# Patient Record
Sex: Male | Born: 1990 | Race: White | Hispanic: No | Marital: Single | Smoking: Current every day smoker
Health system: Southern US, Community
[De-identification: ages and names within clinical notes are randomized; demographics above are authoritative.]

---

## 2011-03-18 ENCOUNTER — Emergency Department (HOSPITAL_COMMUNITY)
Admission: EM | Admit: 2011-03-18 | Discharge: 2011-03-18 | Disposition: A | Discharge status: Home / Self Care | Payer: No Typology Code available for payment source | Attending: Emergency Medicine | Admitting: Emergency Medicine

## 2011-03-18 DIAGNOSIS — M545 Low back pain, unspecified: Secondary | ICD-10-CM | POA: Insufficient documentation

## 2011-03-18 DIAGNOSIS — T148XXA Other injury of unspecified body region, initial encounter: Secondary | ICD-10-CM | POA: Insufficient documentation

## 2011-03-18 DIAGNOSIS — M542 Cervicalgia: Secondary | ICD-10-CM | POA: Insufficient documentation

## 2011-03-18 DIAGNOSIS — F319 Bipolar disorder, unspecified: Secondary | ICD-10-CM | POA: Insufficient documentation

## 2012-04-29 ENCOUNTER — Encounter (HOSPITAL_COMMUNITY): Payer: Self-pay

## 2012-04-29 ENCOUNTER — Emergency Department (HOSPITAL_COMMUNITY)
Admission: EM | Admit: 2012-04-29 | Discharge: 2012-04-30 | Disposition: A | Discharge status: Home / Self Care | Payer: Medicaid Other | Attending: Emergency Medicine | Admitting: Emergency Medicine

## 2012-04-29 DIAGNOSIS — L0231 Cutaneous abscess of buttock: Secondary | ICD-10-CM | POA: Insufficient documentation

## 2012-04-29 DIAGNOSIS — F17200 Nicotine dependence, unspecified, uncomplicated: Secondary | ICD-10-CM | POA: Insufficient documentation

## 2012-04-29 MED ORDER — SULFAMETHOXAZOLE-TMP DS 800-160 MG PO TABS
2.0000 | ORAL_TABLET | Freq: Once | ORAL | Status: AC
  Administered 2012-04-29: 2 via ORAL
  Filled 2012-04-29: qty 2

## 2012-04-29 MED ORDER — LIDOCAINE HCL (PF) 1 % IJ SOLN
5.0000 mL | Freq: Once | INTRAMUSCULAR | Status: AC
  Administered 2012-04-29: 5 mL

## 2012-04-29 MED ORDER — OXYCODONE-ACETAMINOPHEN 5-325 MG PO TABS: 1.0000 | ORAL_TABLET | ORAL | Status: AC | PRN

## 2012-04-29 MED ORDER — OXYCODONE-ACETAMINOPHEN 5-325 MG PO TABS
1.0000 | ORAL_TABLET | Freq: Once | ORAL | Status: AC
  Administered 2012-04-29: 1 via ORAL
  Filled 2012-04-29: qty 1

## 2012-04-29 MED ORDER — SULFAMETHOXAZOLE-TRIMETHOPRIM 800-160 MG PO TABS: 1.0000 | ORAL_TABLET | Freq: Two times a day (BID) | ORAL | Status: AC

## 2012-04-29 MED ORDER — LIDOCAINE HCL (PF) 1 % IJ SOLN
INTRAMUSCULAR | Status: AC
  Filled 2012-04-29: qty 5

## 2012-04-29 NOTE — ED Notes (Signed)
Have a boil on left buttock, was seen at [facility] for the same.

## 2012-04-29 NOTE — ED Provider Notes (Signed)
History     CSN: [id number]  Arrival date & time 04/29/12  2257   None     Chief Complaint  Patient presents with  . Abscess    HPI Brady Marshall is a 21 y.o. male who presents to the ED for an abscess. The abscess started 4 days ago. The abscess is located on the left buttock. He went to [facility] ED and the area was draining so they gave him Rx for antibiotics but they sent it to the wrong pharmacy and he did not get it. The area continues to drain and is very painful. He rates the pain 9/10.  Pain increases with sitting. He is not taking anything for pain. He has used warm compresses. The history was provided by the patient.   History reviewed. No pertinent past medical history.  History reviewed. No pertinent past surgical history.  History reviewed. No pertinent family history.  History  Substance Use Topics  . Smoking status: Current Everyday Smoker  . Smokeless tobacco: Not on file  . Alcohol Use: No      Review of Systems  Constitutional: Negative for fever, chills, diaphoresis and fatigue.  HENT: Negative for ear pain, congestion, sore throat, facial swelling, neck pain, neck stiffness, dental problem and sinus pressure.   Eyes: Negative for photophobia, pain and discharge.  Respiratory: Negative for cough, chest tightness and wheezing.   Cardiovascular: Negative for chest pain and palpitations.  Gastrointestinal: Negative for nausea, vomiting, abdominal pain, diarrhea, constipation and abdominal distention.  Genitourinary: Negative for dysuria, frequency, flank pain and difficulty urinating.  Musculoskeletal: Negative for myalgias, back pain and gait problem.  Skin: Negative for color change.       Abscess left buttock  Neurological: Positive for headaches. Negative for dizziness, speech difficulty, weakness, light-headedness and numbness.  Psychiatric/Behavioral: Negative for confusion and agitation. Nervous/anxious: bipolar, taking medication.     Allergies    Review of patient's allergies indicates no known allergies.  Home Medications  No current outpatient prescriptions on file.  BP 139/74  Pulse 80  Temp 98.4 F (36.9 C) (Oral)  Resp 16  Ht 5\' 9"  (1.753 m)  Wt 190 lb (86.183 kg)  BMI 28.06 kg/m2  SpO2 100%  Physical Exam  Nursing note and vitals reviewed. Constitutional: He is oriented to person, place, and time. He appears well-developed and well-nourished. No distress.  HENT:  Head: Normocephalic and atraumatic.  Eyes: EOM are normal.  Neck: Neck supple.  Cardiovascular: Normal rate.   Pulmonary/Chest: Effort normal. No respiratory distress. He has no wheezes.  Abdominal: Soft. There is no tenderness.       Raised, tender area with erythema left buttocks. A second area is smaller raise and red but does not appear ready for I&D.  Musculoskeletal: Normal range of motion. He exhibits no edema.  Neurological: He is alert and oriented to person, place, and time. No cranial nerve deficit.  Skin: Skin is warm and dry.  Psychiatric: He has a normal mood and affect. His behavior is normal. Judgment and thought content normal.    ED Course  Procedures Verbal consent.   Patient positioned and draped with sterile towels.  Preoperative medication:  Area cleaned with betadine Local infiltrate with lidocaine 1%. Amount 3 ccs  I&D Scalpel size: #11blade Incision type: Straight single Complexity: Complex Drained small amount of purulent drainage Probed with curved hemostat to break up loculations Irrigated with NSS  Wound treatment:  Packing: Iodoform gauze Patient tolerance: Tolerated procedure well.  Assessment: Abscess left buttocks  Plan:  Septra DS   Percocet   Sitz baths   Return in 48 hours for packing removal return sooner for problems. MDM   Medication List  As of 04/29/2012 11:51 PM   START taking these medications         oxyCODONE-acetaminophen 5-325 MG per tablet   Commonly known as: PERCOCET/ROXICET    Take 1 tablet by mouth every 4 (four) hours as needed for pain.      sulfamethoxazole-trimethoprim 800-160 MG per tablet   Commonly known as: BACTRIM DS,SEPTRA DS   Take 1 tablet by mouth every 12 (twelve) hours.          Where to get your medications    These are the prescriptions that you need to pick up.   You may get these medications from any pharmacy.         oxyCODONE-acetaminophen 5-325 MG per tablet   sulfamethoxazole-trimethoprim 800-160 MG per tablet           Follow-up Information    Follow up with [facility] ED in 2 days.   Contact information:   [address] [place] [address]         I have reviewed the patients vital signs and nurses notes. I have discussed procedure and plan of care with the patient. He will continue warm water soaks and antbitiotics and hopefully the second area will improve without I&D. He will return in 2 days for recheck and packing removal. Patient voices understanding.       Janne Napoleon, [place] 04/29/12 [phone]

## 2012-04-30 NOTE — ED Provider Notes (Signed)
Medical screening examination/treatment/procedure(s) were performed by non-physician practitioner and as supervising physician I was immediately available for consultation/collaboration.   Davian Wollenberg, MD 04/30/12 1502 

## 2015-06-28 ENCOUNTER — Emergency Department (HOSPITAL_COMMUNITY)
Admission: EM | Admit: 2015-06-28 | Discharge: 2015-06-29 | Disposition: A | Discharge status: Home / Self Care | Payer: Self-pay | Attending: Emergency Medicine | Admitting: Emergency Medicine

## 2015-06-28 ENCOUNTER — Emergency Department (HOSPITAL_COMMUNITY): Payer: Medicaid Other

## 2015-06-28 ENCOUNTER — Encounter (HOSPITAL_COMMUNITY): Payer: Self-pay | Admitting: *Deleted

## 2015-06-28 DIAGNOSIS — R000 Tachycardia, unspecified: Secondary | ICD-10-CM | POA: Insufficient documentation

## 2015-06-28 DIAGNOSIS — N50812 Left testicular pain: Secondary | ICD-10-CM

## 2015-06-28 DIAGNOSIS — N50819 Testicular pain, unspecified: Secondary | ICD-10-CM

## 2015-06-28 DIAGNOSIS — Z720 Tobacco use: Secondary | ICD-10-CM | POA: Insufficient documentation

## 2015-06-28 DIAGNOSIS — N451 Epididymitis: Secondary | ICD-10-CM | POA: Insufficient documentation

## 2015-06-28 LAB — URINE MICROSCOPIC-ADD ON

## 2015-06-28 LAB — URINALYSIS, ROUTINE W REFLEX MICROSCOPIC
BILIRUBIN URINE: NEGATIVE
GLUCOSE, UA: 250 mg/dL — AB
Nitrite: NEGATIVE
PH: 6 (ref 5.0–8.0)
Protein, ur: NEGATIVE mg/dL
Urobilinogen, UA: 1 mg/dL (ref 0.0–1.0)

## 2015-06-28 MED ORDER — DOXYCYCLINE HYCLATE 100 MG PO TABS
100.0000 mg | ORAL_TABLET | Freq: Once | ORAL | Status: AC
  Administered 2015-06-28: 100 mg via ORAL
  Filled 2015-06-28: qty 1

## 2015-06-28 MED ORDER — CEFTRIAXONE SODIUM 250 MG IJ SOLR
250.0000 mg | Freq: Once | INTRAMUSCULAR | Status: AC
  Administered 2015-06-28: 250 mg via INTRAMUSCULAR
  Filled 2015-06-28: qty 250

## 2015-06-28 MED ORDER — MORPHINE SULFATE (PF) 4 MG/ML IV SOLN
INTRAVENOUS | Status: AC
  Administered 2015-06-28: 4 mg
  Filled 2015-06-28: qty 1

## 2015-06-28 MED ORDER — HYDROCODONE-ACETAMINOPHEN 7.5-325 MG PO TABS: 1.0000 | ORAL_TABLET | Freq: Four times a day (QID) | ORAL | Status: DC | PRN

## 2015-06-28 MED ORDER — SODIUM CHLORIDE 0.9 % IV BOLUS (SEPSIS): 1000.0000 mL | Freq: Once | INTRAVENOUS | Status: DC

## 2015-06-28 MED ORDER — PROMETHAZINE HCL 25 MG/ML IJ SOLN: 12.5000 mg | Freq: Four times a day (QID) | INTRAMUSCULAR | Status: DC | PRN

## 2015-06-28 MED ORDER — LIDOCAINE HCL (PF) 1 % IJ SOLN
INTRAMUSCULAR | Status: AC
  Administered 2015-06-28: 5 mL
  Filled 2015-06-28: qty 5

## 2015-06-28 MED ORDER — DOXYCYCLINE HYCLATE 100 MG PO CAPS: 100.0000 mg | ORAL_CAPSULE | Freq: Two times a day (BID) | ORAL | Status: AC

## 2015-06-28 NOTE — ED Provider Notes (Signed)
CSN: [id number]     Arrival date & time 06/28/15  2049 History   None    Chief Complaint  Patient presents with  . Groin Swelling     (Consider location/radiation/quality/duration/timing/severity/associated sxs/prior Treatment) HPI Comments: 24 y.o. Male with no significant past medical history presents for left testicle pain and swelling.  The patient reports the pain started abruptly when he turned over in his sleep last night.  He has had extreme pain and swelling of the left testicle since then that has been constant.  He reports normal urination.  He has never had anything like this happen before.  Denies penile complaints.   History reviewed. No pertinent past medical history. History reviewed. No pertinent past surgical history. History reviewed. No pertinent family history. Social History  Substance Use Topics  . Smoking status: Current Every Day Smoker  . Smokeless tobacco: None  . Alcohol Use: No    Review of Systems  Constitutional: Negative for fever, chills and fatigue.  HENT: Negative for congestion, postnasal drip and rhinorrhea.   Eyes: Negative for pain.  Respiratory: Negative for chest tightness and shortness of breath.   Cardiovascular: Negative for chest pain and palpitations.  Gastrointestinal: Negative for nausea, abdominal pain and diarrhea.  Genitourinary: Positive for scrotal swelling and testicular pain. Negative for urgency, hematuria, flank pain, decreased urine volume, discharge, penile swelling and penile pain.  Musculoskeletal: Negative for myalgias and back pain.  Neurological: Negative for dizziness, light-headedness and headaches.  Hematological: Does not bruise/bleed easily.      Allergies  Cinnamon and Honey bee treatment  Home Medications   Prior to Admission medications   Not on File   BP 140/93 mmHg  Pulse 124  Temp(Src) 98.1 F (36.7 C) (Oral)  Resp 20  Ht  (1.727 m)  Wt 198 lb (89.812 kg)  BMI 30.11 kg/m2  SpO2  100% Physical Exam  Constitutional: He is oriented to person, place, and time. He appears distressed (appears uncomfortable).  HENT:  Head: Normocephalic and atraumatic.  Right Ear: External ear normal.  Left Ear: External ear normal.  Mouth/Throat: Oropharynx is clear and moist. No oropharyngeal exudate.  Eyes: EOM are normal. Pupils are equal, round, and reactive to light.  Neck: Normal range of motion. Neck supple.  Cardiovascular: Regular rhythm, normal heart sounds and intact distal pulses.  Tachycardia present.   Pulmonary/Chest: Effort normal. No respiratory distress.  Abdominal: Soft. He exhibits no distension. There is no tenderness. Hernia confirmed negative in the right inguinal area and confirmed negative in the left inguinal area.  Genitourinary: Right testis shows no swelling and no tenderness. Cremasteric reflex is absent on the right side. Left testis shows swelling and tenderness. Cremasteric reflex is absent on the left side.  Patient would not allow a full examination of his penis.  Could not elicit cremasteric reflex on right or left side  Musculoskeletal: He exhibits no edema.  Lymphadenopathy:       Right: No inguinal adenopathy present.       Left: No inguinal adenopathy present.  Neurological: He is alert and oriented to person, place, and time.  Skin: Skin is warm and dry. No rash noted.  Vitals reviewed.   ED Course  Procedures (including critical care time) Labs Review Labs Reviewed  URINALYSIS, ROUTINE W REFLEX MICROSCOPIC (NOT AT [facility])  GC/CHLAMYDIA PROBE AMP () NOT AT [facility]    Imaging Review No results found. I have personally reviewed and evaluated these images and lab results as  part of my medical decision-making.   EKG Interpretation None      MDM  Patient seen and evaluated in stable condition.  Concern for torsion.  Unable to reduce.  [place] ordered.  UA and GC/Chlamydia ordered.  Patient given Morphine for pain control.  Case was  signed out to night team pending [place] results with plan for treatment for orchitis if negative for torsion. Final diagnoses:  None    1. Left testicular pain and swelling    Leta Baptist, MD 06/28/15 2312

## 2015-06-28 NOTE — Discharge Instructions (Signed)
You can take ibuprofen 600 mg 4 times a day OR Aleve 2 tabs twice a day for pain. Take the antibiotics until gone. Recheck if you seem worse, such as worsening swelling, increasing pain, trouble urinating.    Epididymitis Epididymitis is swelling (inflammation) of the epididymis. The epididymis is a cord-like structure that is located along the top and back part of the testicle. It collects and stores sperm from the testicle. This condition can also cause pain and swelling of the testicle and scrotum. Symptoms usually start suddenly (acute epididymitis). Sometimes epididymitis starts gradually and lasts for a while (chronic epididymitis). This type may be harder to treat. CAUSES In men 11 and younger, this condition is usually caused by a bacterial infection or sexually transmitted disease (STD), such as:  Gonorrhea.  Chlamydia.  In men 80 and older who do not have anal sex, this condition is usually caused by bacteria from a blockage or abnormalities in the urinary system. These can result from:  Having a tube placed into the bladder (urinary catheter).  Having an enlarged or inflamed prostate gland.  Having recent urinary tract surgery. In men who have a condition that weakens the body's defense system (immune system), such as HIV, this condition can be caused by:   Other bacteria, including tuberculosis and syphilis.  Viruses.  Fungi. Sometimes this condition occurs without infection. That may happen if urine flows backward into the epididymis after heavy lifting or straining. RISK FACTORS This condition is more likely to develop in men:  Who have unprotected sex with more than one partner.  Who have anal sex.   Who have recently had surgery.   Who have a urinary catheter.  Who have urinary problems.  Who have a suppressed immune system. SYMPTOMS  This condition usually begins suddenly with chills, fever, and pain behind the scrotum and in the testicle. Other  symptoms include:   Swelling of the scrotum, testicle, or both.  Pain whenejaculatingor urinating.  Pain in the back or belly.  Nausea.  Itching and discharge from the penis.  Frequent need to pass urine.  Redness and tenderness of the scrotum. DIAGNOSIS Your health care provider can diagnose this condition based on your symptoms and medical history. Your health care provider will also do a physical exam to ask about your symptoms and check your scrotum and testicle for swelling, pain, and redness. You may also have other tests, including:   Examination of discharge from the penis.  Urine tests for infections, such as STDs.  Your health care provider may test you for other STDs, including HIV. TREATMENT Treatment for this condition depends on the cause. If your condition is caused by a bacterial infection, oral antibiotic medicine may be prescribed. If the bacterial infection has spread to your blood, you may need to receive IV antibiotics. Nonbacterial epididymitis is treated with home care that includes bed rest and elevation of the scrotum. Surgery may be needed to treat:  Bacterial epididymitis that causes pus to build up in the scrotum (abscess).  Chronic epididymitis that has not responded to other treatments. HOME CARE INSTRUCTIONS Medicines  Take over-the-counter and prescription medicines only as told by your health care provider.   If you were prescribed an antibiotic medicine, take it as told by your health care provider. Do not stop taking the antibiotic even if your condition improves. Sexual Activity  If your epididymitis was caused by an STD, avoid sexual activity until your treatment is complete.  Inform your sexual partner  or partners if you test positive for an STD. They may need to be treated.Do not engage in sexual activity with your partner or partners until their treatment is completed. General Instructions  Return to your normal activities as  told by your health care provider. Ask your health care provider what activities are safe for you.  Keep your scrotum elevated and supported while resting. Ask your health care provider if you should wear a scrotal support, such as a jockstrap. Wear it as told by your health care provider.  If directed, apply ice to the affected area:   Put ice in a plastic bag.  Place a towel between your skin and the bag.  Leave the ice on for 20 minutes, 2-3 times per day.  Try taking a sitz bath to help with discomfort. This is a warm water bath that is taken while you are sitting down. The water should only come up to your hips and should cover your buttocks. Do this 3-4 times per day or as told by your health care provider.  Keep all follow-up visits as told by your health care provider. This is important. SEEK MEDICAL CARE IF:   You have a fever.   Your pain medicine is not helping.   Your pain is getting worse.   Your symptoms do not improve within three days.   This information is not intended to replace advice given to you by your health care provider. Make sure you discuss any questions you have with your health care provider.   Document Released: 09/03/2000 Document Revised: 05/28/2015 Document Reviewed: 01/22/2015 Elsevier Interactive Patient Education [employer]

## 2015-06-28 NOTE — ED Notes (Signed)
Pt c/o left testicle swelling since last night.

## 2015-06-28 NOTE — ED Provider Notes (Signed)
Patient left the changes shift to get results of his ultrasound of his scrotum and Dopplers of his testicles. Patient was given his test results. He was treated with Rocephin 250 mg IM and started on doxycycline.  US Scrotum  US Art/ven Flow Abd Pelv Doppler  06/28/2015   CLINICAL DATA:  LEFT testicular pain and swelling for 1 day.  EXAM: ULTRASOUND OF SCROTUM  TECHNIQUE: Complete ultrasound examination of the testicles, epididymis, and other scrotal structures was performed.  COMPARISON:  None.  FINDINGS: Right testicle  Measurements: 3.4 x 2 x 2.7 cm. No mass or microlithiasis visualized.  Left testicle  Measurements: 3.6 x 2.6 x 3.3 cm. No mass or microlithiasis visualized.  Right epididymis:  Normal in size and appearance.  Left epididymis:  The head is normal in size and appearance.  Hydrocele:  Small LEFT hydrocele.  Varicocele:  None visualized.  In the LEFT inguinal canal extending to the scrotum is a irregular, hypervascular structure in without peristalsis inseparable from the epididymal body and tail  IMPRESSION: LEFT scrotal vascularized structure, this could represent fat containing hernia or, less likely epididymitis without orchitis. No evidence of testicular torsion.  Small LEFT hydrocele.   Electronically Signed   By: Awilda Metro M.D.   On: 06/28/2015 23:18    Diagnoses that have been ruled out:  None  Diagnoses that are still under consideration:  None  Final diagnoses:  Left testicular pain  Epididymitis, left    New Prescriptions   DOXYCYCLINE (VIBRAMYCIN) 100 MG CAPSULE    Take 1 capsule (100 mg total) by mouth 2 (two) times daily.    Plan discharge  Devoria Albe, MD, Concha Pyo, MD 06/28/15 [phone]

## 2017-01-17 IMAGING — US US SCROTUM
1 series · 14 of 25 positions shown · non-contrast
Comparison: None.

CLINICAL DATA: LEFT testicular pain and swelling for 1 day.

EXAM:
ULTRASOUND OF SCROTUM
TECHNIQUE: Complete ultrasound examination of the testicles, epididymis, and
other scrotal structures was performed.

[Series 1: us scrotum · 0.06mm/px · 85 acquisitions, 14 frames shown]
[im 1/85]
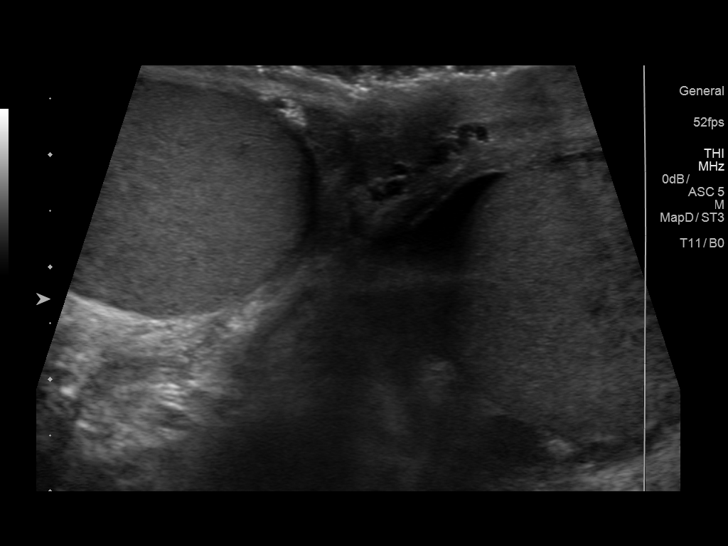
[im 8/85]
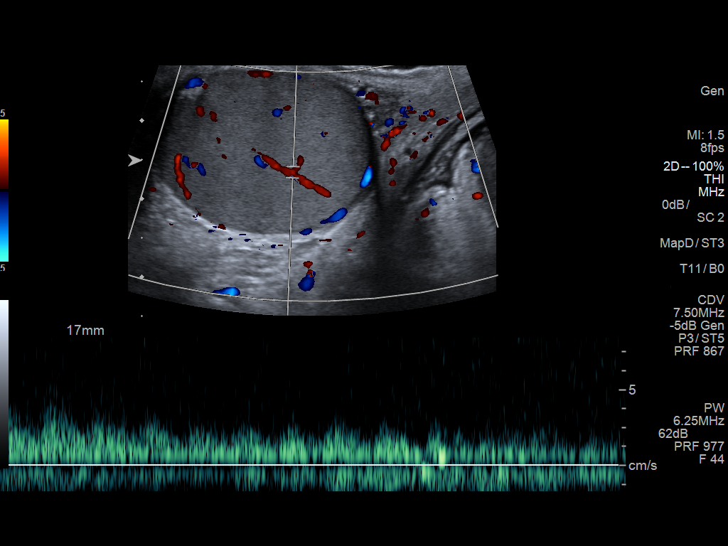
[im 15/85]
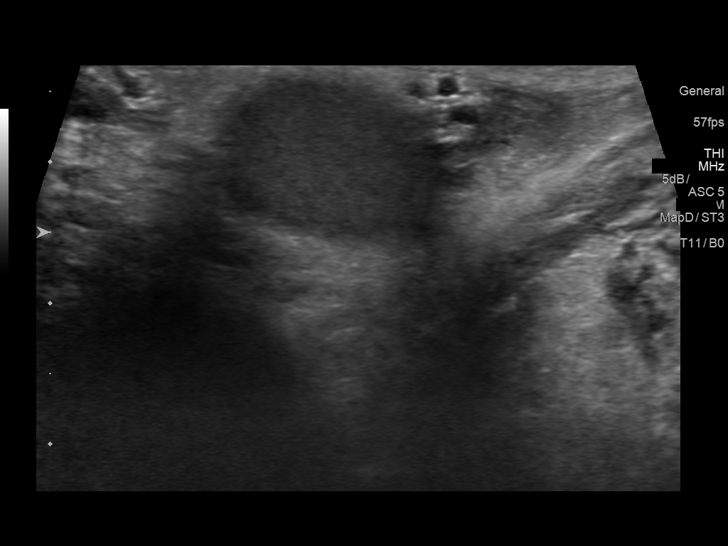
[im 22/85]
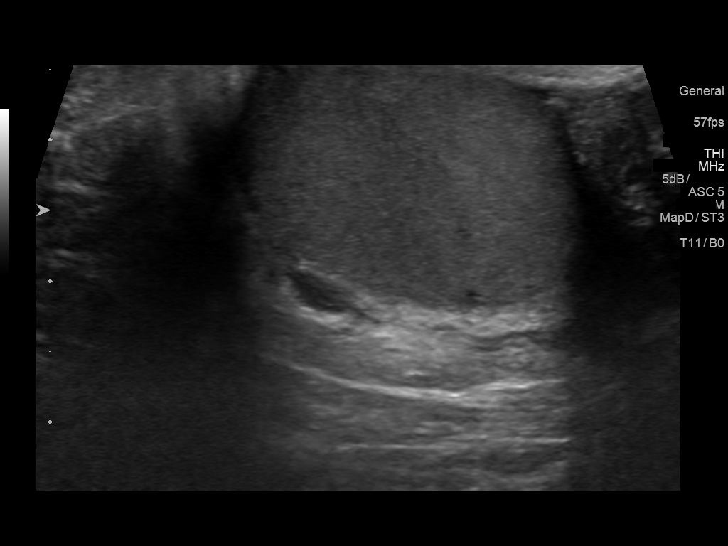
[im 29/85]
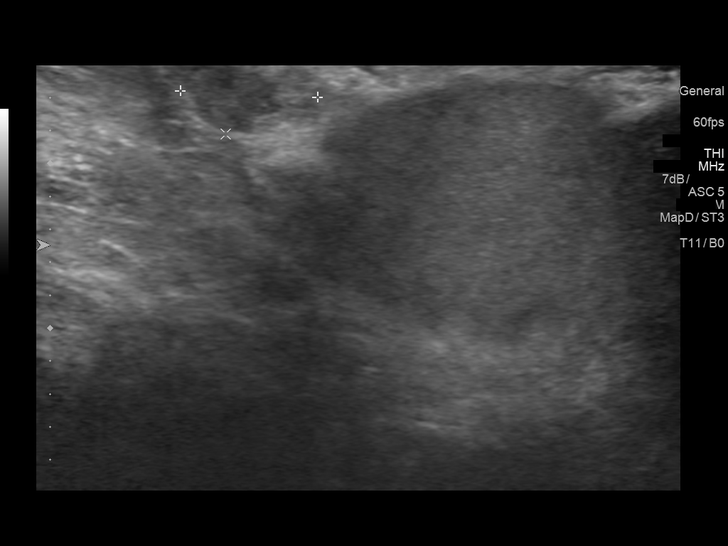
[im 32/85]
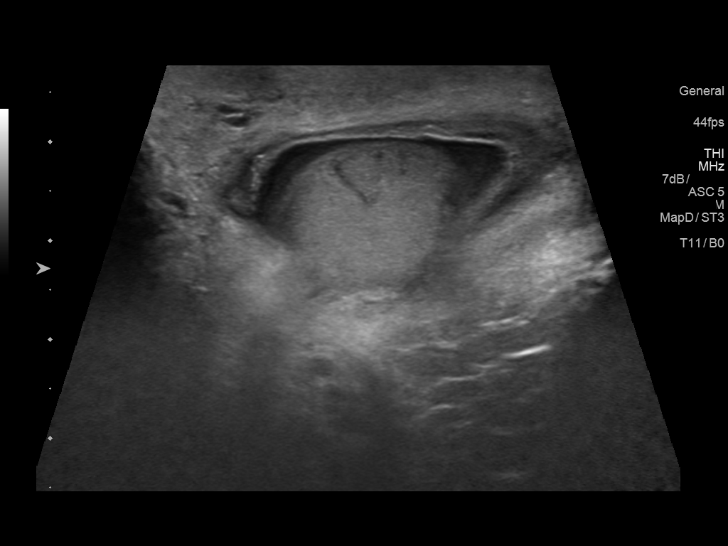
[im 39/85]
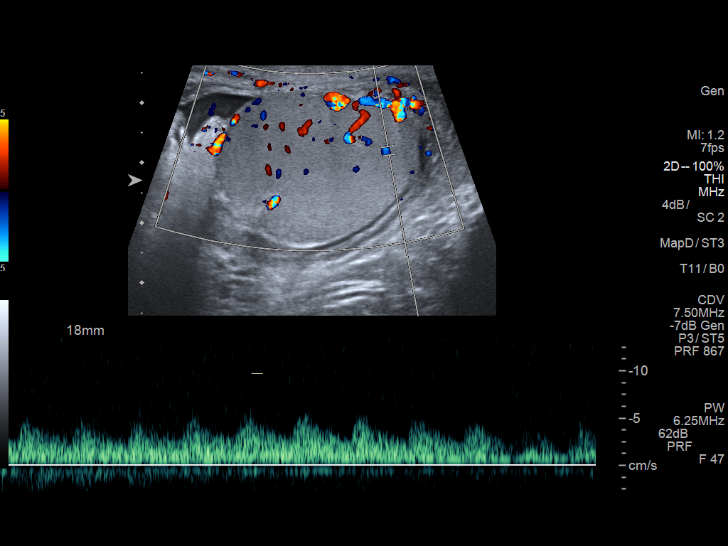
[im 46/85]
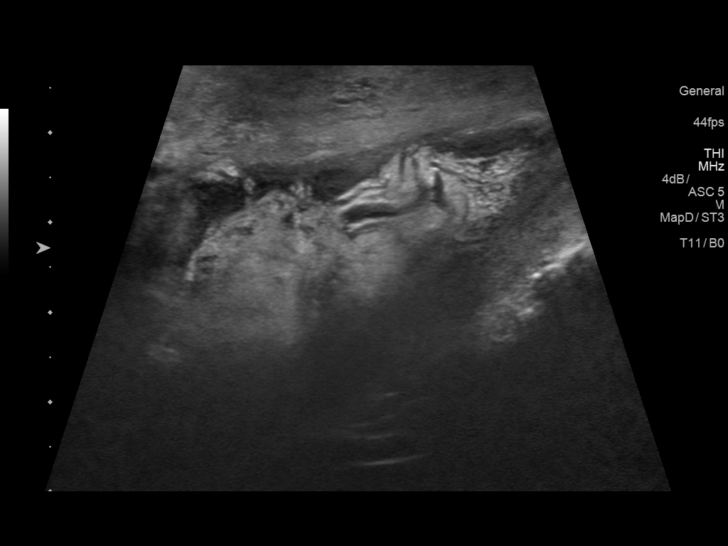
[im 53/85]
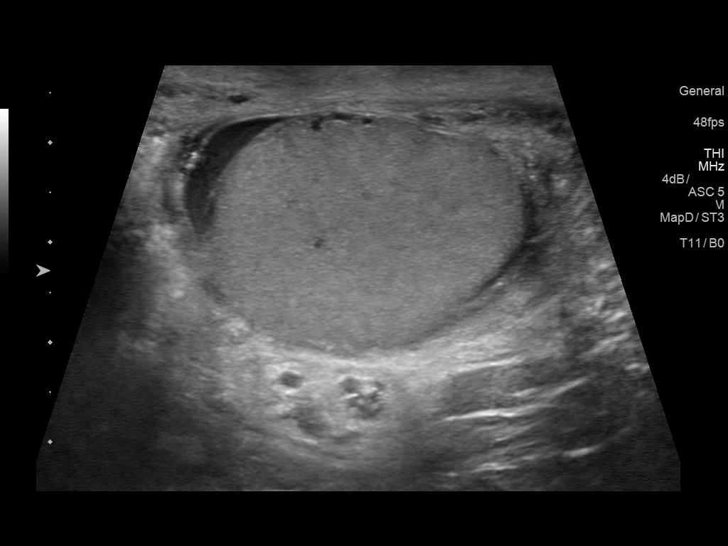
[im 57/85]
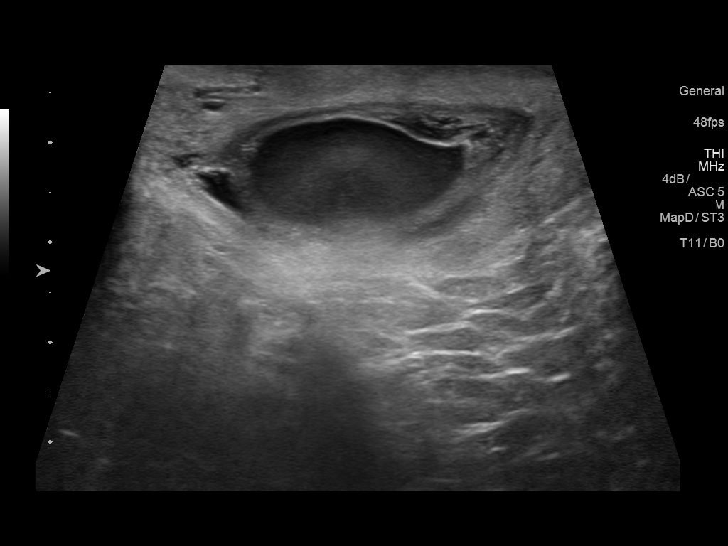
[im 64/85]
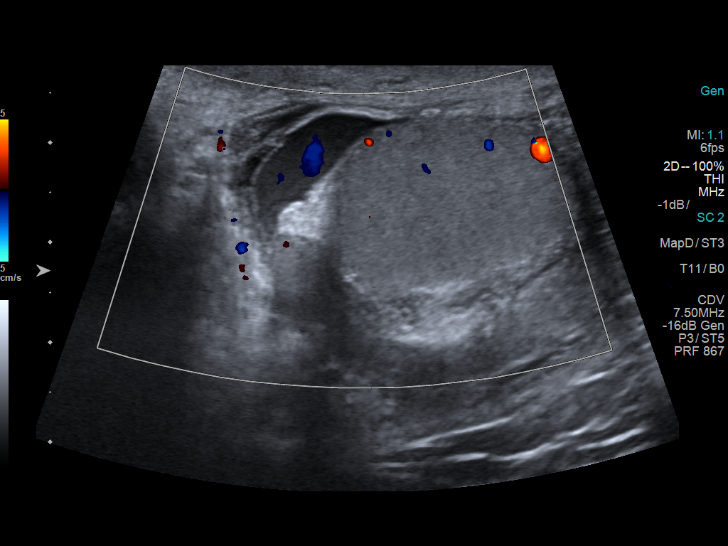
[im 71/85]
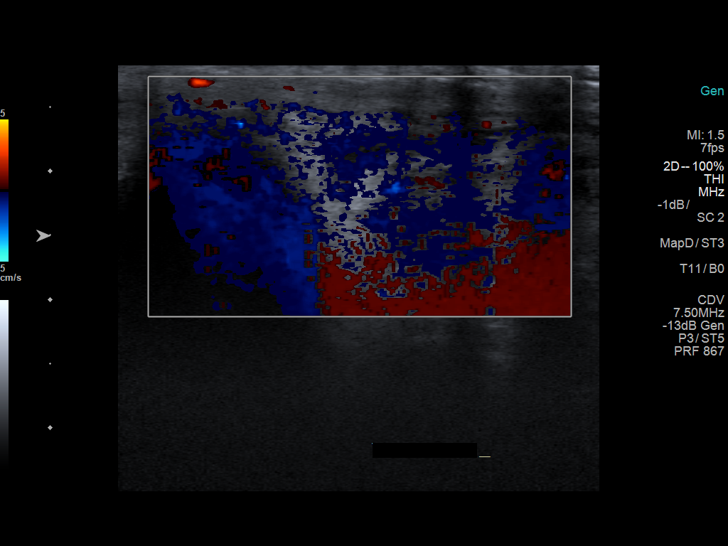
[im 78/85]
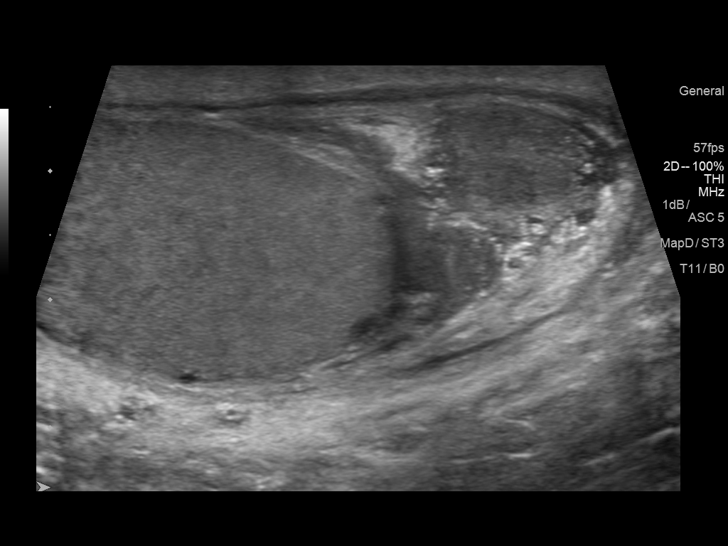
[im 85/85]
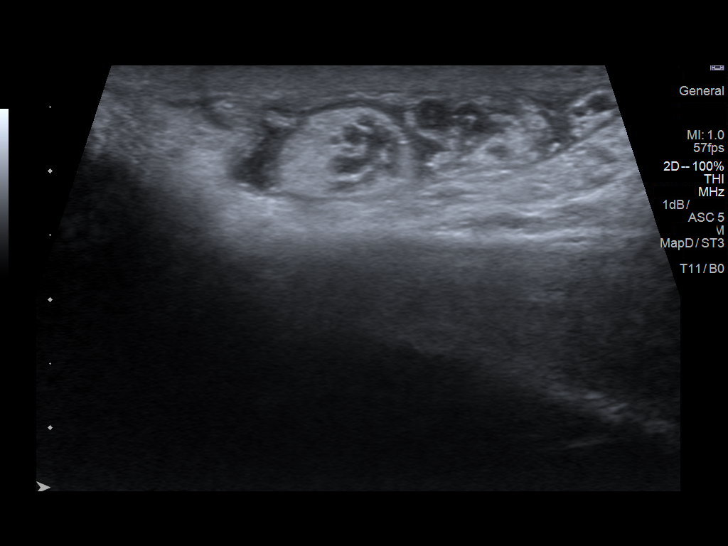

[14 of 25 positions shown; findings below may reference images not displayed]

FINDINGS: Right testicle

Measurements: 3.4 x 2 x 2.7 cm. No mass or microlithiasis
visualized.

Left testicle

Measurements: 3.6 x 2.6 x 3.3 cm. No mass or microlithiasis
visualized.

Right epididymis:  Normal in size and appearance.

Left epididymis:  The head is normal in size and appearance.

Hydrocele:  Small LEFT hydrocele.

Varicocele:  None visualized.

In the LEFT inguinal canal extending to the scrotum is a irregular,
hypervascular structure in without peristalsis inseparable from the
epididymal body and tail
IMPRESSION: LEFT scrotal vascularized structure, this could represent fat
containing hernia or, less likely epididymitis without orchitis. No
evidence of testicular torsion.

Small LEFT hydrocele.

## 2024-05-03 NOTE — Congregational Nurse Program (Signed)
 Attempted to followup wp with pt by phone on today (8.14.25) upon his completion of enrollment with the [facility] [facility] program manager, Diane E. that took place on 8.12.25.    Left voice and sent text message for patient to return a call so that I maybe able to assist him further with getting a PCP appointment established as requested [facility]

## 2024-05-08 ENCOUNTER — Telehealth: Payer: Self-pay

## 2024-05-08 NOTE — Telephone Encounter (Signed)
 Attempted to speak with patient by phone on 8.18.25 with the assistance of his girlfriend to attempt to schedule the patient's first PCP appointment with the [facility] as previously requested during his initial [employer] [facility] [facility] enrollment with Diane E.(Program Manager) as a home visit.      However, the pt and his girlfriend ended the call by hanging up before the questions/health interview could be completed.  Prior to call ending the pt's  girlfriend stated aggressively that the pt had to go to work and the call automatically dropped  Plan Made a 2nd attempt and called the pt back but call directly went to voicemail  Made a 3rd attempt by sending a text message for the pt to return the call back when ready to schedule his PCP appt

## 2024-05-10 ENCOUNTER — Telehealth: Payer: Self-pay

## 2024-05-10 NOTE — Telephone Encounter (Signed)
 Made a 4th attempt on 8.20.25 (Wed) to contact the patient by way of his girlfriend's phone number [phone]) as the pt requested initially during his home visit with Diane E.  I have attempted to schedule the pt Brady Marshall) , first medicial provider appt with the [facility], however no success at reaching him  Plan Left a voicemail requesting a return call back to either myself or Diane E. of [facility] who stated she could help assist me with getting the pt to coordinate and reply regarding the best days for him to attend his PCP appt. If successfully with Diane, tereafter Diane and I will consult with each other and I will make the appointment and forward to pt by text message as well as provide the information to Diane to forward the patient by phone if she is sucessful with reaching the pt
